# Patient Record
Sex: Male | Born: 1945 | Race: White | Hispanic: No | Marital: Married
Health system: Southern US, Community
[De-identification: ages and names within clinical notes are randomized; demographics above are authoritative.]

---

## 1997-09-30 ENCOUNTER — Ambulatory Visit: Admission: RE | Admit: 1997-09-30 | Discharge: 1997-09-30 | Payer: Self-pay | Admitting: Cardiovascular Disease

## 2000-09-27 ENCOUNTER — Emergency Department (HOSPITAL_COMMUNITY): Admission: EM | Admit: 2000-09-27 | Discharge: 2000-09-28 | Payer: Self-pay | Admitting: *Deleted

## 2000-09-28 ENCOUNTER — Encounter: Payer: Self-pay | Admitting: *Deleted

## 2003-10-05 ENCOUNTER — Inpatient Hospital Stay (HOSPITAL_COMMUNITY): Admission: EM | Admit: 2003-10-05 | Discharge: 2003-10-18 | Payer: Self-pay | Admitting: Cardiovascular Disease

## 2003-10-22 ENCOUNTER — Encounter
Admission: RE | Admit: 2003-10-22 | Discharge: 2003-10-22 | Payer: Self-pay | Admitting: Thoracic Surgery (Cardiothoracic Vascular Surgery)

## 2003-11-12 ENCOUNTER — Ambulatory Visit (HOSPITAL_COMMUNITY): Admission: RE | Admit: 2003-11-12 | Discharge: 2003-11-12 | Payer: Self-pay | Admitting: Cardiovascular Disease

## 2003-11-19 ENCOUNTER — Encounter: Admission: RE | Admit: 2003-11-19 | Discharge: 2003-11-19 | Payer: Self-pay | Admitting: Cardiothoracic Surgery

## 2003-11-23 ENCOUNTER — Ambulatory Visit (HOSPITAL_COMMUNITY): Admission: RE | Admit: 2003-11-23 | Discharge: 2003-11-23 | Payer: Self-pay | Admitting: Cardiothoracic Surgery

## 2003-11-26 ENCOUNTER — Encounter: Admission: RE | Admit: 2003-11-26 | Discharge: 2003-11-26 | Payer: Self-pay | Admitting: Cardiothoracic Surgery

## 2003-12-17 ENCOUNTER — Ambulatory Visit (HOSPITAL_COMMUNITY): Admission: RE | Admit: 2003-12-17 | Discharge: 2003-12-17 | Payer: Self-pay | Admitting: Cardiovascular Disease

## 2003-12-18 ENCOUNTER — Emergency Department (HOSPITAL_COMMUNITY): Admission: EM | Admit: 2003-12-18 | Discharge: 2003-12-18 | Payer: Self-pay | Admitting: Emergency Medicine

## 2003-12-24 ENCOUNTER — Encounter: Admission: RE | Admit: 2003-12-24 | Discharge: 2003-12-24 | Payer: Self-pay | Admitting: Cardiothoracic Surgery

## 2003-12-27 ENCOUNTER — Emergency Department (HOSPITAL_COMMUNITY): Admission: EM | Admit: 2003-12-27 | Discharge: 2003-12-27 | Payer: Self-pay | Admitting: *Deleted

## 2004-01-14 ENCOUNTER — Encounter: Admission: RE | Admit: 2004-01-14 | Discharge: 2004-01-14 | Payer: Self-pay | Admitting: Cardiothoracic Surgery

## 2004-01-28 ENCOUNTER — Encounter: Admission: RE | Admit: 2004-01-28 | Discharge: 2004-01-28 | Payer: Self-pay | Admitting: Cardiothoracic Surgery

## 2004-03-10 ENCOUNTER — Ambulatory Visit: Payer: Self-pay | Admitting: Internal Medicine

## 2004-03-22 ENCOUNTER — Ambulatory Visit (HOSPITAL_COMMUNITY): Admission: RE | Admit: 2004-03-22 | Discharge: 2004-03-22 | Payer: Self-pay | Admitting: Cardiovascular Disease

## 2004-04-04 ENCOUNTER — Ambulatory Visit: Payer: Self-pay | Admitting: Internal Medicine

## 2004-05-03 ENCOUNTER — Ambulatory Visit (HOSPITAL_COMMUNITY): Admission: RE | Admit: 2004-05-03 | Discharge: 2004-05-03 | Payer: Self-pay | Admitting: Cardiothoracic Surgery

## 2004-05-10 ENCOUNTER — Inpatient Hospital Stay (HOSPITAL_COMMUNITY): Admission: RE | Admit: 2004-05-10 | Discharge: 2004-05-16 | Payer: Self-pay | Admitting: Cardiothoracic Surgery

## 2004-05-19 ENCOUNTER — Encounter: Admission: RE | Admit: 2004-05-19 | Discharge: 2004-05-19 | Payer: Self-pay | Admitting: Cardiothoracic Surgery

## 2004-06-30 ENCOUNTER — Encounter: Admission: RE | Admit: 2004-06-30 | Discharge: 2004-06-30 | Payer: Self-pay | Admitting: Cardiothoracic Surgery

## 2004-07-08 ENCOUNTER — Emergency Department (HOSPITAL_COMMUNITY): Admission: EM | Admit: 2004-07-08 | Discharge: 2004-07-08 | Payer: Self-pay | Admitting: Emergency Medicine

## 2004-07-12 ENCOUNTER — Ambulatory Visit: Payer: Self-pay | Admitting: Internal Medicine

## 2004-07-20 ENCOUNTER — Ambulatory Visit: Payer: Self-pay | Admitting: Internal Medicine

## 2004-07-20 ENCOUNTER — Ambulatory Visit (HOSPITAL_COMMUNITY): Admission: RE | Admit: 2004-07-20 | Discharge: 2004-07-20 | Payer: Self-pay | Admitting: Internal Medicine

## 2004-07-27 ENCOUNTER — Ambulatory Visit (HOSPITAL_COMMUNITY): Admission: RE | Admit: 2004-07-27 | Discharge: 2004-07-27 | Payer: Self-pay | Admitting: Internal Medicine

## 2004-08-25 ENCOUNTER — Ambulatory Visit: Payer: Self-pay | Admitting: Internal Medicine

## 2004-09-21 ENCOUNTER — Ambulatory Visit: Payer: Self-pay | Admitting: Internal Medicine

## 2004-09-27 ENCOUNTER — Inpatient Hospital Stay (HOSPITAL_COMMUNITY): Admission: EM | Admit: 2004-09-27 | Discharge: 2004-10-03 | Payer: Self-pay | Admitting: Emergency Medicine

## 2005-01-31 ENCOUNTER — Ambulatory Visit: Payer: Self-pay | Admitting: Cardiology

## 2005-03-16 ENCOUNTER — Inpatient Hospital Stay (HOSPITAL_COMMUNITY): Admission: EM | Admit: 2005-03-16 | Discharge: 2005-03-23 | Payer: Self-pay | Admitting: Emergency Medicine

## 2005-03-19 ENCOUNTER — Ambulatory Visit: Payer: Self-pay | Admitting: Internal Medicine

## 2006-08-01 IMAGING — CR DG CHEST 2V
2 series · 2 of 2 positions shown · non-contrast
Comparison: none

CLINICAL DATA: Post catheterization; chest pain 
 CHEST
 Two views of the chest show cardiomegaly to be present.  Somewhat prominent interstitial markings most likely are chronic.  Comparison with prior chest x-ray is recommended.  Median sternotomy sutures are noted from prior CABG.  
 IMPRESSION
 Cardiomegaly and probable chronic interstitial change.  Compare with prior chest x-ray.

[view not recorded (1 of 2)]
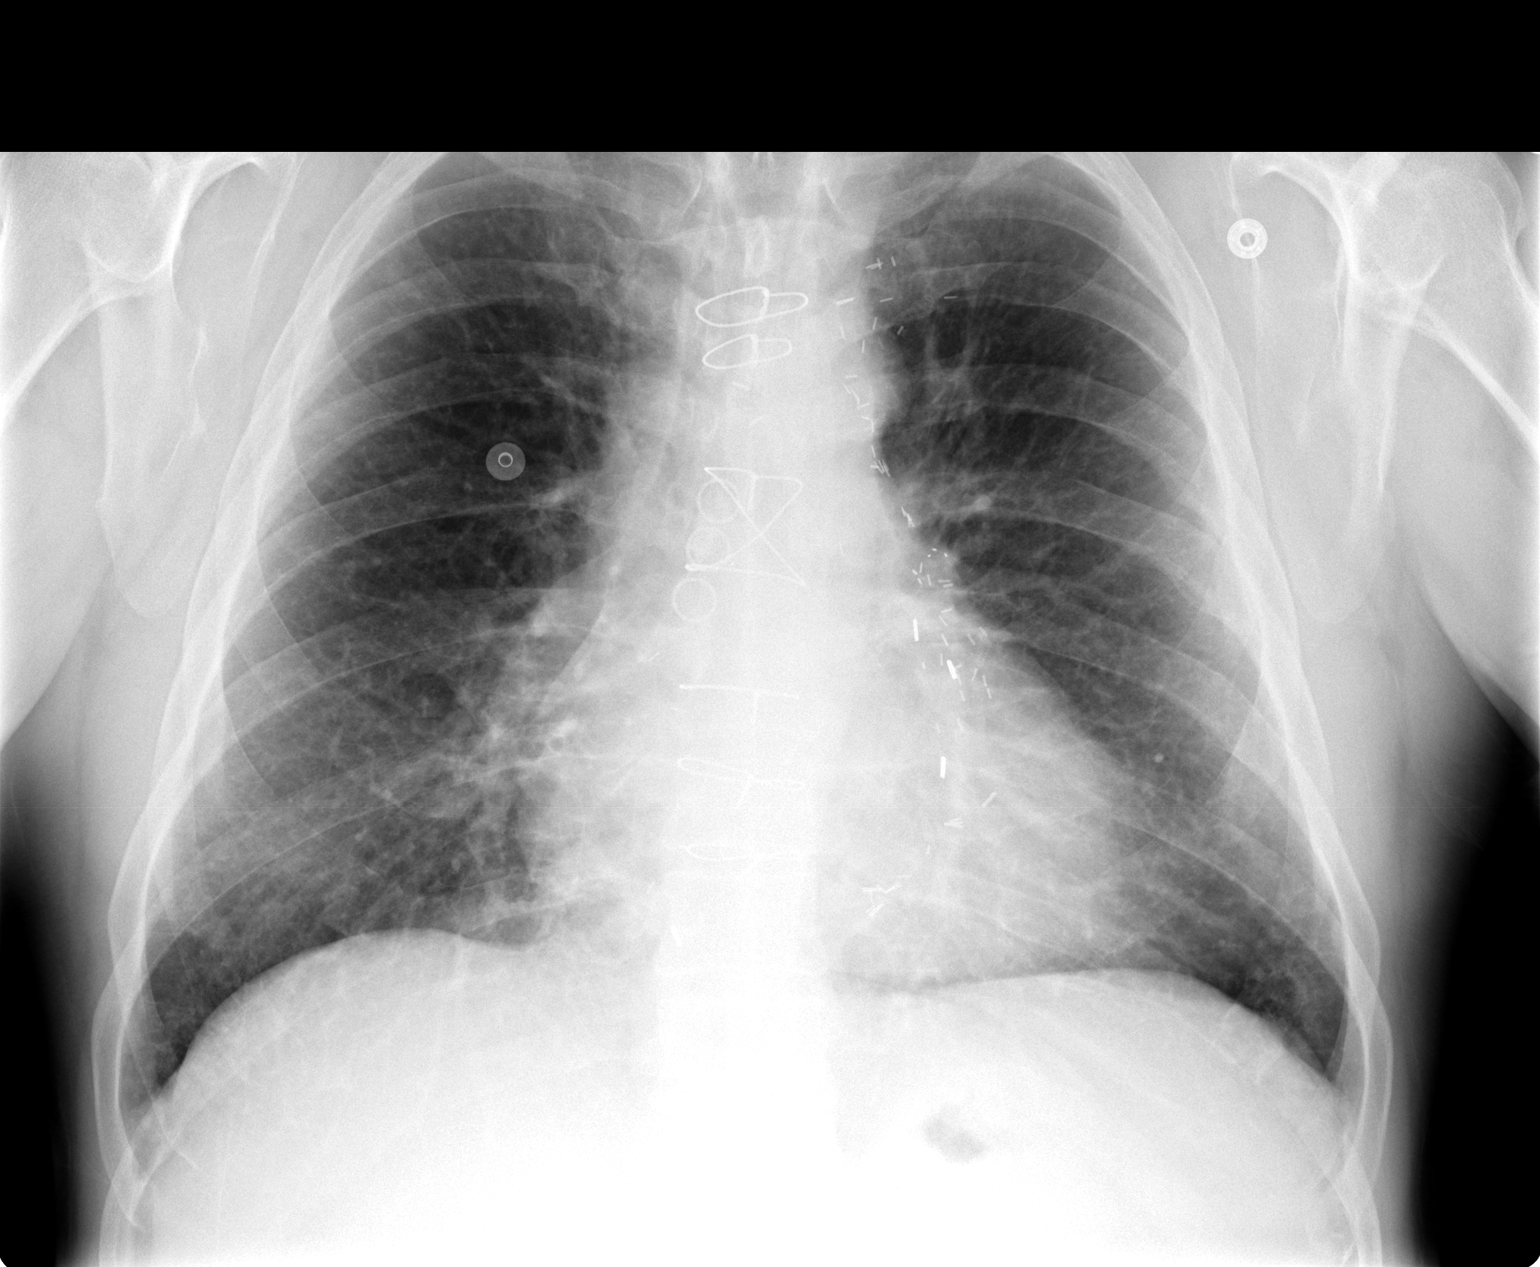

[view not recorded (2 of 2)]
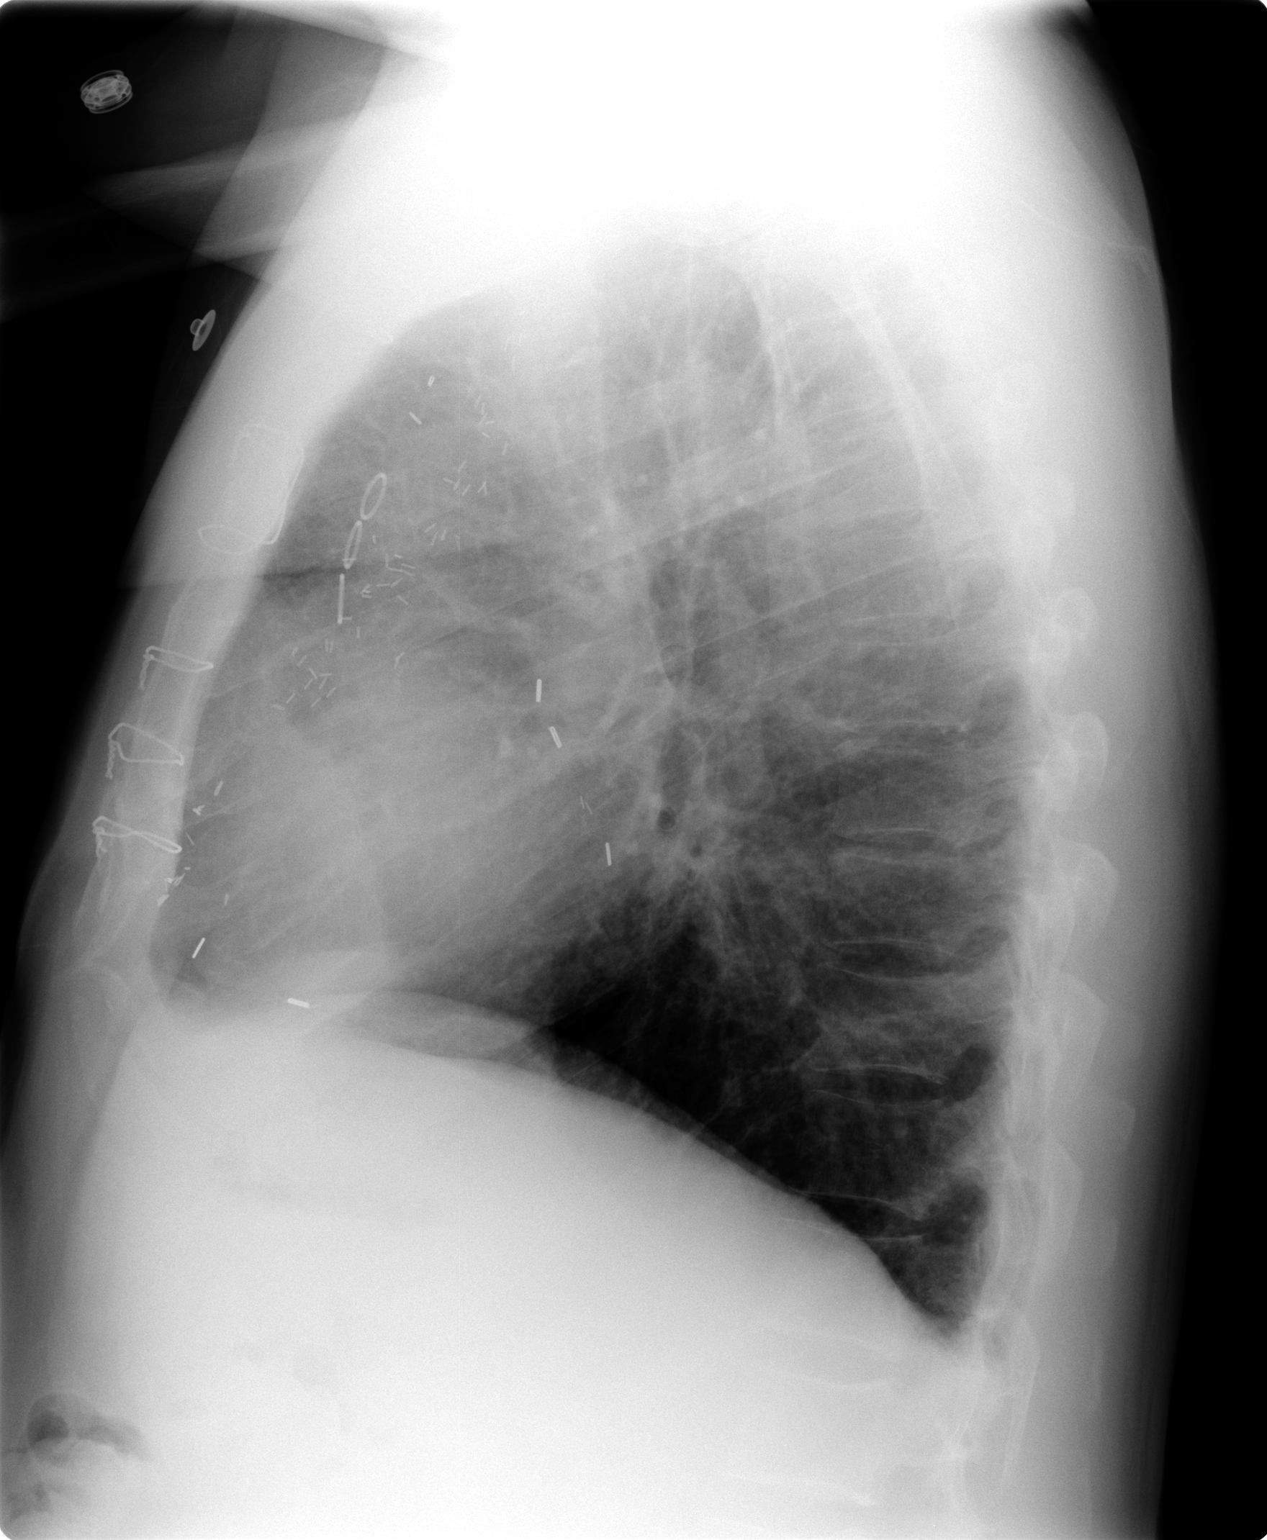

[2 of 2 positions shown; findings below may reference images not displayed]

## 2006-08-06 IMAGING — CR DG CHEST 1V PORT
1 series · 1 of 1 positions shown · non-contrast
Comparison: none

CLINICAL DATA: bypass surgery
 PORTABLE CHEST 
 Single portable view of the chest is compared to preoperative study from 10/07/03.  The endotracheal tube is in good position at the mid-tracheal level.  There is an NG tube coursing down into the stomach.  There are mediastinal drain tubes and a right-sided chest tube.  No pneumothorax is seen.  Heart is mildly enlarged but stable.  Minimal edema and atelectasis.
 IMPRESSION
 Expected postoperative changes with support apparatus in good position without complicating features.  
 Cardiac enlargement with mild vascular congestion and streaky areas of atelectasis.  
 Swan-Ganz catheter is in the proximal right pulmonary artery.

[view not recorded]
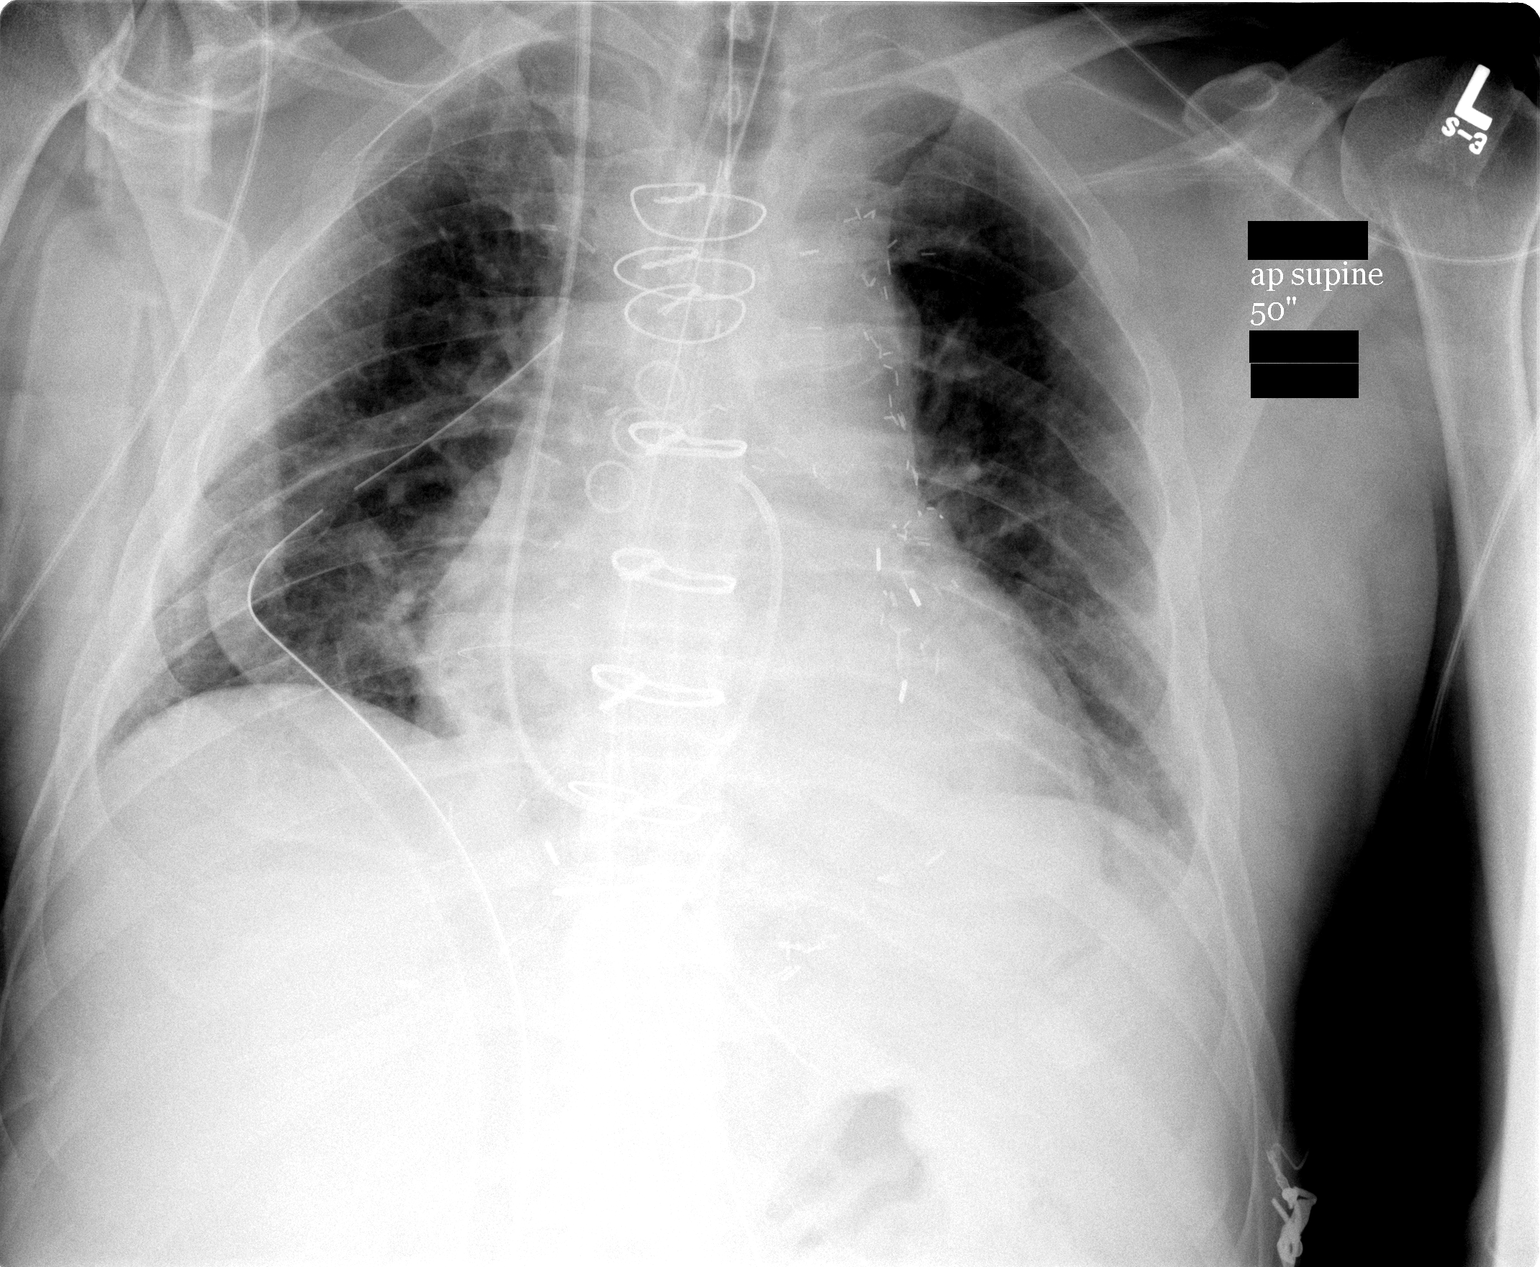

[1 of 1 positions shown; findings below may reference images not displayed]

## 2006-08-07 IMAGING — CR DG CHEST 1V PORT
1 series · 1 of 1 positions shown · non-contrast
Comparison: none

CLINICAL DATA: Chest pain. 
 PORTABLE CHEST ([DATE] HOURS)

[view not recorded]
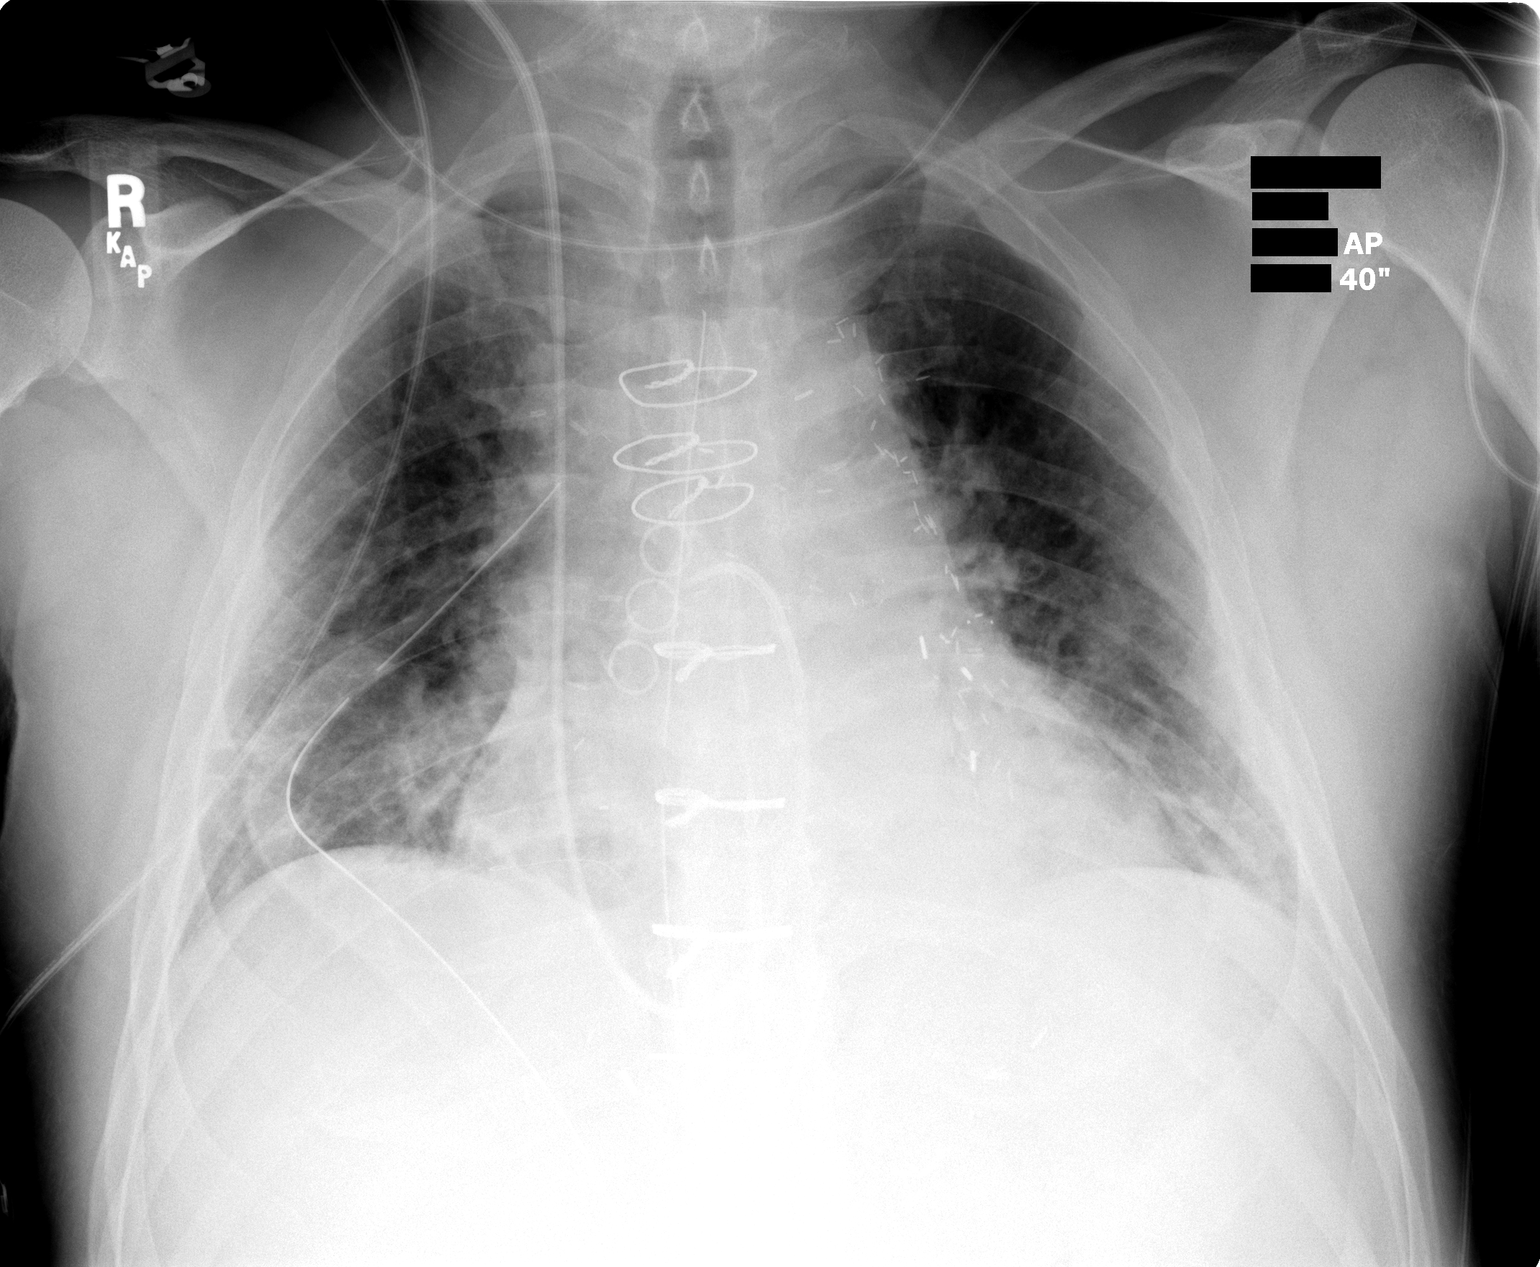

[1 of 1 positions shown; findings below may reference images not displayed]

FINDINGS: The endotracheal and NG tubes have been removed.  The Swan-Ganz catheter and mediastinal and chest tubes are stable.  The lungs remain under inflated.  Interstitial pulmonary edema is stable.  No pneumothoraces are seen. 
 IMPRESSION
 Removal of the endotracheal and NG tubes.  Stable pulmonary edema.

## 2006-08-08 IMAGING — CR DG CHEST 1V PORT
1 series · 1 of 1 positions shown · non-contrast
Comparison: 10/13/03
 FINDINGS
 The Swan-Ganz catheter has been removed with the right IJ vein introducer left in place.

CLINICAL DATA: Chest pain. 
 PORTABLE CHEST ? 10/14/03 ([DATE] hours)

[view not recorded]
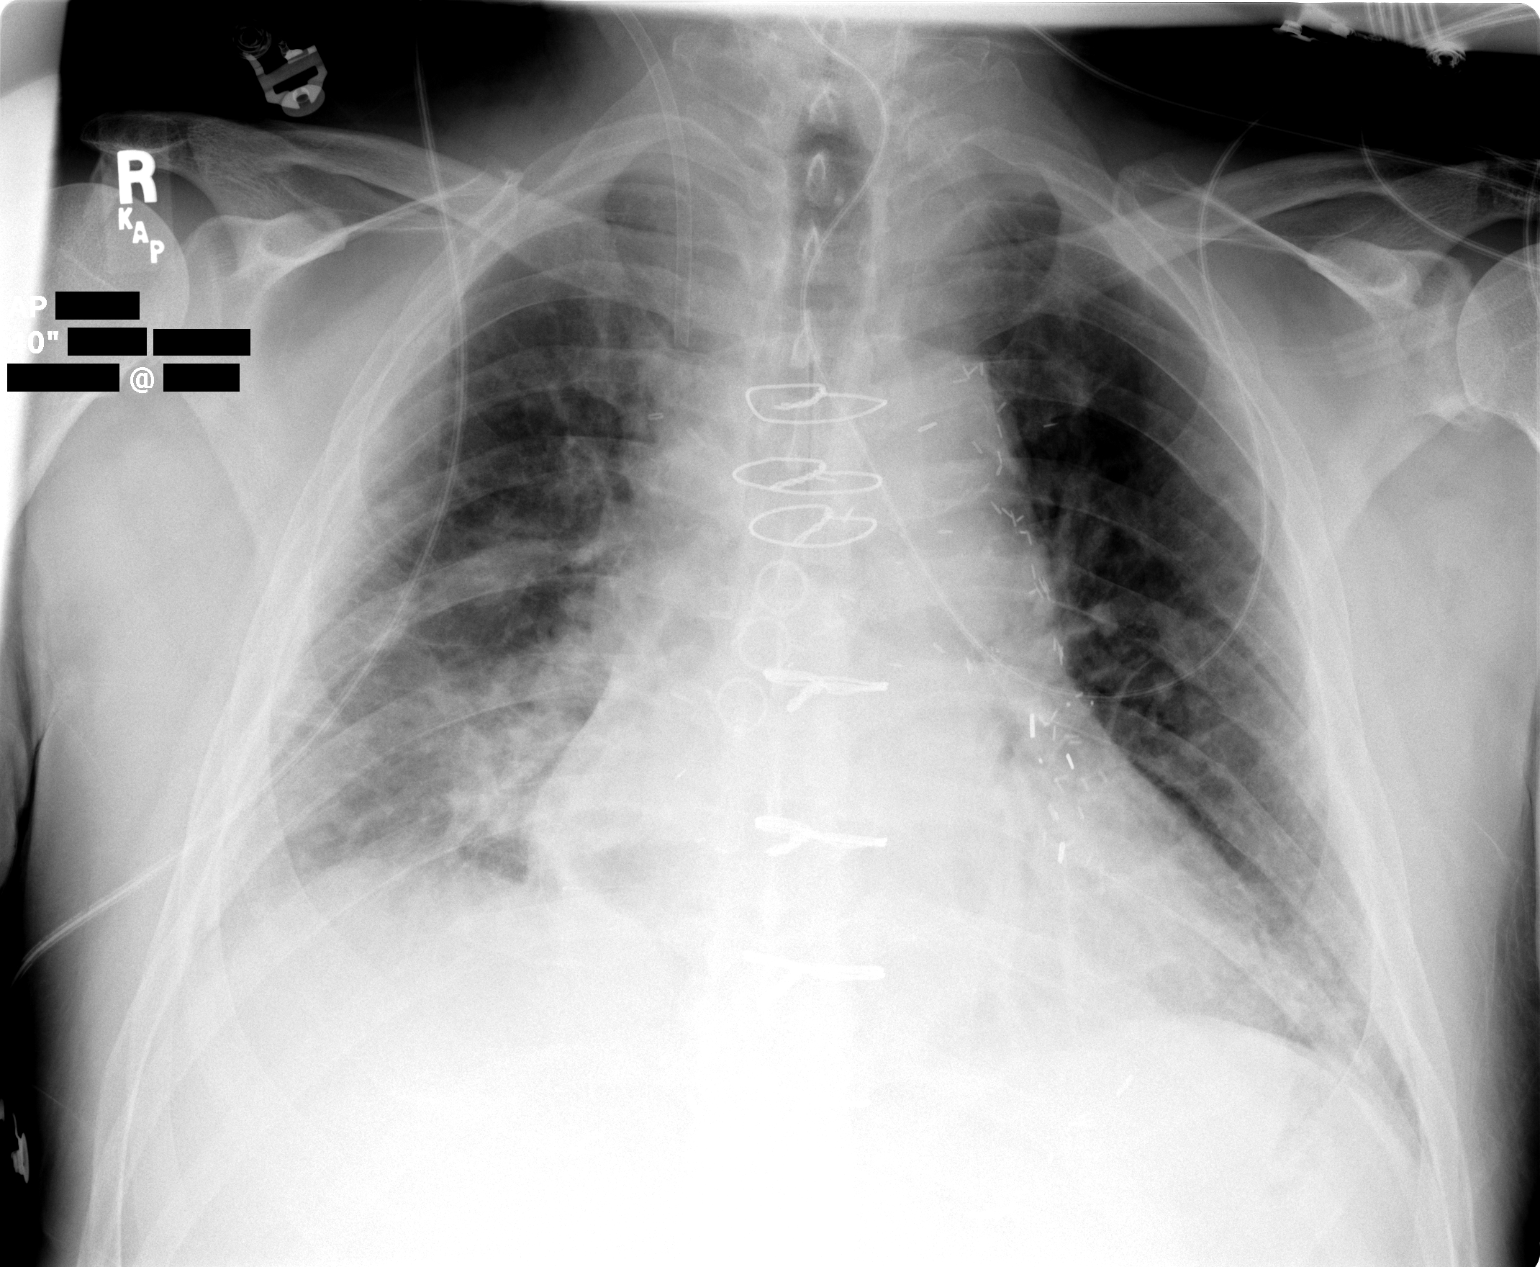

[1 of 1 positions shown; findings below may reference images not displayed]

The chest tubes have been removed.   No pneumothoraces are seen.  Pulmonary edema is unchanged.  The heart remains prominent.  
 IMPRESSION 
 Removal of the chest tubes without pneumothorax.  
 Removal of the Swan-Ganz catheter.  
 Stable pulmonary edema.

## 2010-03-19 ENCOUNTER — Encounter: Payer: Self-pay | Admitting: Internal Medicine

## 2011-03-06 ENCOUNTER — Telehealth (HOSPITAL_COMMUNITY): Payer: Self-pay | Admitting: *Deleted

## 2011-03-06 NOTE — Telephone Encounter (Signed)
Patient's wife called in and said that he is extremely weak. Patient said that if he had to rank this on a scale of 0-10 he would rank his weakness at a 5. He is not having diarrhea, no mouth sores, no hand-foot. He did shower today. No dizziness. Some nausea today in which he took an Ativan. Patient took Oxali on thurs or Friday of last week and is taking Xeloda 2000mg  q12 hours. Please address what you want done to Jonathan Douglas (that is if you want anything done).

## 2011-03-07 ENCOUNTER — Telehealth (HOSPITAL_COMMUNITY): Payer: Self-pay | Admitting: *Deleted

## 2011-03-07 NOTE — Telephone Encounter (Signed)
The previous note from 03/06/11 was entered under the wrong [employer].

## 2013-02-17 ENCOUNTER — Encounter (HOSPITAL_COMMUNITY): Payer: Self-pay | Admitting: *Deleted

## 2013-10-05 ENCOUNTER — Telehealth (HOSPITAL_COMMUNITY): Payer: Self-pay | Admitting: *Deleted

## 2013-10-05 NOTE — Telephone Encounter (Signed)
Patient's wife called requesting u/s and paracentesis for comfort. Scheduled with Richard for 130 today. Orders in place per Dr. Zigmund DanielFormanek  and patient notified.
